# Patient Record
Sex: Female | Born: 1965 | Race: White | Hispanic: No | Marital: Married
Health system: Southern US, Community
[De-identification: ages and names within clinical notes are randomized; demographics above are authoritative.]

---

## 1998-09-22 ENCOUNTER — Other Ambulatory Visit: Admission: RE | Admit: 1998-09-22 | Discharge: 1998-09-22 | Payer: Self-pay | Admitting: *Deleted

## 1999-06-01 ENCOUNTER — Inpatient Hospital Stay (HOSPITAL_COMMUNITY): Admission: AD | Admit: 1999-06-01 | Discharge: 1999-06-03 | Payer: Self-pay | Admitting: Obstetrics and Gynecology

## 1999-06-04 ENCOUNTER — Encounter: Admission: RE | Admit: 1999-06-04 | Discharge: 1999-06-13 | Payer: Self-pay | Admitting: Obstetrics and Gynecology

## 1999-10-05 ENCOUNTER — Other Ambulatory Visit: Admission: RE | Admit: 1999-10-05 | Discharge: 1999-10-05 | Payer: Self-pay | Admitting: Obstetrics and Gynecology

## 2000-10-09 ENCOUNTER — Other Ambulatory Visit: Admission: RE | Admit: 2000-10-09 | Discharge: 2000-10-09 | Payer: Self-pay | Admitting: Obstetrics and Gynecology

## 2001-11-04 ENCOUNTER — Other Ambulatory Visit: Admission: RE | Admit: 2001-11-04 | Discharge: 2001-11-04 | Payer: Self-pay | Admitting: Obstetrics and Gynecology

## 2002-03-09 ENCOUNTER — Encounter: Payer: Self-pay | Admitting: Obstetrics and Gynecology

## 2002-03-09 ENCOUNTER — Ambulatory Visit (HOSPITAL_COMMUNITY): Admission: RE | Admit: 2002-03-09 | Discharge: 2002-03-09 | Payer: Self-pay | Admitting: Obstetrics and Gynecology

## 2002-04-03 ENCOUNTER — Encounter: Payer: Self-pay | Admitting: Obstetrics and Gynecology

## 2002-04-03 ENCOUNTER — Ambulatory Visit (HOSPITAL_COMMUNITY): Admission: RE | Admit: 2002-04-03 | Discharge: 2002-04-03 | Payer: Self-pay | Admitting: Obstetrics and Gynecology

## 2002-07-30 ENCOUNTER — Encounter: Payer: Self-pay | Admitting: Obstetrics and Gynecology

## 2002-07-30 ENCOUNTER — Ambulatory Visit (HOSPITAL_COMMUNITY): Admission: RE | Admit: 2002-07-30 | Discharge: 2002-07-30 | Payer: Self-pay | Admitting: Obstetrics and Gynecology

## 2002-08-14 ENCOUNTER — Inpatient Hospital Stay (HOSPITAL_COMMUNITY): Admission: AD | Admit: 2002-08-14 | Discharge: 2002-08-16 | Payer: Self-pay | Admitting: Obstetrics and Gynecology

## 2002-12-23 ENCOUNTER — Other Ambulatory Visit: Admission: RE | Admit: 2002-12-23 | Discharge: 2002-12-23 | Payer: Self-pay | Admitting: Obstetrics and Gynecology

## 2005-06-22 ENCOUNTER — Ambulatory Visit: Payer: Self-pay | Admitting: Internal Medicine

## 2009-01-18 ENCOUNTER — Ambulatory Visit (HOSPITAL_COMMUNITY): Admission: RE | Admit: 2009-01-18 | Discharge: 2009-01-18 | Payer: Self-pay | Admitting: Neurology

## 2010-06-07 ENCOUNTER — Other Ambulatory Visit: Payer: Self-pay | Admitting: Obstetrics and Gynecology

## 2010-06-07 DIAGNOSIS — R928 Other abnormal and inconclusive findings on diagnostic imaging of breast: Secondary | ICD-10-CM

## 2010-06-13 ENCOUNTER — Ambulatory Visit
Admission: RE | Admit: 2010-06-13 | Discharge: 2010-06-13 | Disposition: A | Discharge status: Home / Self Care | Payer: BC Managed Care – PPO | Source: Ambulatory Visit | Attending: Obstetrics and Gynecology | Admitting: Obstetrics and Gynecology

## 2010-06-13 DIAGNOSIS — R928 Other abnormal and inconclusive findings on diagnostic imaging of breast: Secondary | ICD-10-CM

## 2010-07-13 LAB — VDRL, CSF: VDRL Quant, CSF: NONREACTIVE

## 2010-07-13 LAB — GRAM STAIN

## 2010-07-13 LAB — MULTIPLE SCLEROSIS PANEL 2
Albumin: 3570 mg/dL — ABNORMAL LOW (ref 3700–5410)
IgA MSPROF: <0.001 mg/dL (ref <0.010)
IgG Total CSF: 2.7 mg/dL (ref 0.5–6.1)
IgG Total: 1423 mg/dL (ref 694–1618)
IgM MSPROF: <0.001 mg/dL (ref <0.010)
IgM-CSF: 0.05 mg/dL (ref <0.10)

## 2010-07-13 LAB — CSF CULTURE W GRAM STAIN: Culture: NO GROWTH

## 2010-07-13 LAB — CSF CELL COUNT WITH DIFFERENTIAL
Tube #: 1
Tube #: 4

## 2010-07-13 LAB — PROTEIN AND GLUCOSE, CSF
Glucose, CSF: 64 mg/dL (ref 43–76)
Total  Protein, CSF: 27 mg/dL (ref 15–45)

## 2010-07-13 LAB — ANGIOTENSIN CONVERTING ENZYME: Angiotensin-Converting Enzyme: 19 U/L (ref 9–67)

## 2010-12-05 ENCOUNTER — Other Ambulatory Visit: Payer: Self-pay | Admitting: Obstetrics and Gynecology

## 2010-12-05 DIAGNOSIS — N63 Unspecified lump in unspecified breast: Secondary | ICD-10-CM

## 2010-12-26 ENCOUNTER — Ambulatory Visit
Admission: RE | Admit: 2010-12-26 | Discharge: 2010-12-26 | Disposition: A | Discharge status: Home / Self Care | Payer: BC Managed Care – PPO | Source: Ambulatory Visit | Attending: Obstetrics and Gynecology | Admitting: Obstetrics and Gynecology

## 2010-12-26 ENCOUNTER — Other Ambulatory Visit: Payer: Self-pay | Admitting: Obstetrics and Gynecology

## 2010-12-26 DIAGNOSIS — N63 Unspecified lump in unspecified breast: Secondary | ICD-10-CM

## 2011-01-03 ENCOUNTER — Ambulatory Visit
Admission: RE | Admit: 2011-01-03 | Discharge: 2011-01-03 | Disposition: A | Discharge status: Home / Self Care | Payer: BC Managed Care – PPO | Source: Ambulatory Visit | Attending: Obstetrics and Gynecology | Admitting: Obstetrics and Gynecology

## 2011-01-03 ENCOUNTER — Other Ambulatory Visit: Payer: Self-pay | Admitting: Obstetrics and Gynecology

## 2011-01-03 DIAGNOSIS — N63 Unspecified lump in unspecified breast: Secondary | ICD-10-CM

## 2011-06-13 ENCOUNTER — Other Ambulatory Visit: Payer: Self-pay | Admitting: Obstetrics and Gynecology

## 2011-06-13 DIAGNOSIS — N6310 Unspecified lump in the right breast, unspecified quadrant: Secondary | ICD-10-CM

## 2011-06-26 ENCOUNTER — Ambulatory Visit
Admission: RE | Admit: 2011-06-26 | Discharge: 2011-06-26 | Disposition: A | Discharge status: Home / Self Care | Payer: BC Managed Care – PPO | Source: Ambulatory Visit | Attending: Obstetrics and Gynecology | Admitting: Obstetrics and Gynecology

## 2011-06-26 ENCOUNTER — Other Ambulatory Visit: Payer: Self-pay | Admitting: Obstetrics and Gynecology

## 2011-06-26 DIAGNOSIS — N6310 Unspecified lump in the right breast, unspecified quadrant: Secondary | ICD-10-CM

## 2012-06-09 ENCOUNTER — Emergency Department (HOSPITAL_BASED_OUTPATIENT_CLINIC_OR_DEPARTMENT_OTHER)
Admission: EM | Admit: 2012-06-09 | Discharge: 2012-06-09 | Disposition: A | Discharge status: Home / Self Care | Payer: PRIVATE HEALTH INSURANCE | Attending: Emergency Medicine | Admitting: Emergency Medicine

## 2012-06-09 ENCOUNTER — Emergency Department (HOSPITAL_COMMUNITY): Payer: PRIVATE HEALTH INSURANCE

## 2012-06-09 ENCOUNTER — Emergency Department (HOSPITAL_BASED_OUTPATIENT_CLINIC_OR_DEPARTMENT_OTHER): Payer: PRIVATE HEALTH INSURANCE

## 2012-06-09 ENCOUNTER — Encounter (HOSPITAL_BASED_OUTPATIENT_CLINIC_OR_DEPARTMENT_OTHER): Payer: Self-pay | Admitting: *Deleted

## 2012-06-09 DIAGNOSIS — G43909 Migraine, unspecified, not intractable, without status migrainosus: Secondary | ICD-10-CM | POA: Insufficient documentation

## 2012-06-09 DIAGNOSIS — R202 Paresthesia of skin: Secondary | ICD-10-CM

## 2012-06-09 LAB — CBC WITH DIFFERENTIAL/PLATELET
Eosinophils Absolute: 0 10*3/uL (ref 0.0–0.7)
Eosinophils Relative: 0 % (ref 0–5)
Hemoglobin: 13.6 g/dL (ref 12.0–15.0)
Lymphs Abs: 1.2 10*3/uL (ref 0.7–4.0)
MCH: 29.9 pg (ref 26.0–34.0)
MCV: 85.9 fL (ref 78.0–100.0)
Monocytes Absolute: 0.6 10*3/uL (ref 0.1–1.0)
Monocytes Relative: 9 % (ref 3–12)
RBC: 4.55 MIL/uL (ref 3.87–5.11)

## 2012-06-09 LAB — BASIC METABOLIC PANEL
BUN: 20 mg/dL (ref 6–23)
Calcium: 9.3 mg/dL (ref 8.4–10.5)
Creatinine, Ser: 0.7 mg/dL (ref 0.50–1.10)
GFR calc non Af Amer: >90 mL/min (ref >90)
Glucose, Bld: 105 mg/dL — ABNORMAL HIGH (ref 70–99)
Potassium: 3.9 mEq/L (ref 3.5–5.1)

## 2012-06-09 MED ORDER — KETOROLAC TROMETHAMINE 30 MG/ML IJ SOLN: 30.0000 mg | Freq: Once | INTRAMUSCULAR | Status: DC

## 2012-06-09 MED ORDER — GADOBENATE DIMEGLUMINE 529 MG/ML IV SOLN
15.0000 mL | Freq: Once | INTRAVENOUS | Status: AC | PRN
  Administered 2012-06-09: 15 mL via INTRAVENOUS

## 2012-06-09 MED ORDER — DIPHENHYDRAMINE HCL 50 MG/ML IJ SOLN: 25.0000 mg | Freq: Once | INTRAMUSCULAR | Status: DC

## 2012-06-09 MED ORDER — METOCLOPRAMIDE HCL 5 MG/ML IJ SOLN: 10.0000 mg | Freq: Once | INTRAMUSCULAR | Status: DC

## 2012-06-09 MED ORDER — IBUPROFEN 200 MG PO TABS
400.0000 mg | ORAL_TABLET | Freq: Once | ORAL | Status: AC
  Administered 2012-06-09: 400 mg via ORAL
  Filled 2012-06-09: qty 2

## 2012-06-09 NOTE — ED Provider Notes (Signed)
History     CSN: [id number]  Arrival date & time 06/09/12  1020   First MD Initiated Contact with Patient 06/09/12 1047      Chief Complaint  Patient presents with  . Numbness    (Consider location/radiation/quality/duration/timing/severity/associated sxs/prior treatment) HPI  Patient with numbness began at 0900 on left side face, lue, and lle while at work.  Patient has right sided facial numbness when she has migraines but has never had left sided numbness. Patient took ibuprofen today without relief of numbness.  She denies headache. She weakness, visual problems, walking or speaking.  She has not had similar symptoms in the past on this side of the facealthough she has had right sided facial numbness without headache.  She was assessed with multiple cts and mris per patient to ascertain this. Patient has had similar headaches with right sided symptoms.  These usually resolve withibuprofen.  Last imaging 2-3 years.      PMD Dr. Micheline Chapman,  [facility].     History reviewed. No pertinent past medical history.  History reviewed. No pertinent past surgical history.  History reviewed. No pertinent family history.  History  Substance Use Topics  . Smoking status: Not on file  . Smokeless tobacco: Not on file  . Alcohol Use: Not on file    OB History   Grav Para Term Preterm Abortions TAB SAB Ect Mult Living                  Review of Systems  Constitutional: Negative for fever, chills, activity change, appetite change and unexpected weight change.  HENT: Negative for sore throat, rhinorrhea, neck pain, neck stiffness and sinus pressure.   Eyes: Negative for visual disturbance.  Respiratory: Negative for cough and shortness of breath.   Cardiovascular: Negative for chest pain and leg swelling.  Gastrointestinal: Negative for vomiting, abdominal pain, diarrhea and blood in stool.  Genitourinary: Negative for dysuria, urgency, frequency, vaginal discharge  and difficulty urinating.  Musculoskeletal: Negative for myalgias, arthralgias and gait problem.  Skin: Negative for color change and rash.  Neurological: Negative for weakness, light-headedness and headaches.  Hematological: Does not bruise/bleed easily.  Psychiatric/Behavioral: Negative for dysphoric mood.    Allergies  Review of patient's allergies indicates no known allergies.  Home Medications   Current Outpatient Rx  Name  Route  Sig  Dispense  Refill  . gabapentin (NEURONTIN) 600 MG tablet   Oral   Take 600 mg by mouth daily. To prevent headaches         . ibuprofen (ADVIL,MOTRIN) 400 MG tablet   Oral   Take 400 mg by mouth every 6 (six) hours as needed for pain.           BP 125/54  Pulse 80  Temp(Src) 97.9 F (36.6 C) (Oral)  Resp 18  Ht 5\' 9"  (1.753 m)  Wt 172 lb (78.019 kg)  BMI 25.39 kg/m2  SpO2 100%  LMP 05/24/2012  Physical Exam  Nursing note and vitals reviewed. Constitutional: She is oriented to person, place, and time. She appears well-developed and well-nourished.  HENT:  Head: Normocephalic and atraumatic.  Right Ear: Tympanic membrane and external ear normal.  Left Ear: Tympanic membrane and external ear normal.  Nose: Nose normal. Right sinus exhibits no maxillary sinus tenderness and no frontal sinus tenderness. Left sinus exhibits no maxillary sinus tenderness and no frontal sinus tenderness.  Eyes: Conjunctivae and EOM are normal. Pupils are equal, round, and reactive to light.  Right eye exhibits no nystagmus. Left eye exhibits no nystagmus.  Cardiovascular: Normal rate, regular rhythm, normal heart sounds and intact distal pulses.   Pulmonary/Chest: Effort normal and breath sounds normal. No respiratory distress. She exhibits no tenderness.  Abdominal: Soft. Bowel sounds are normal. She exhibits no distension and no mass. There is no tenderness.  Neurological: She is alert and oriented to person, place, and time. She has normal strength and  normal reflexes. She displays normal reflexes. No sensory deficit. She exhibits normal muscle tone. She displays a negative Romberg sign. Coordination normal. GCS eye subscore is 4. GCS verbal subscore is 5. GCS motor subscore is 6.  Reflex Scores:      Tricep reflexes are 2+ on the right side and 2+ on the left side.      Bicep reflexes are 2+ on the right side and 2+ on the left side.      Brachioradialis reflexes are 2+ on the right side and 2+ on the left side.      Patellar reflexes are 2+ on the right side and 2+ on the left side.      Achilles reflexes are 2+ on the right side and 2+ on the left side. Patient with normal gait without ataxia, shuffling, spasm, or antalgia. Speech is normal without dysarthria, dysphasia, or aphasia. Muscle strength is 5/5 in bilateral shoulders, elbow flexor and extensors, wrist flexor and extensors, and intrinsic hand muscles. 5/5 bilateral lower extremity hip flexors, extensors, knee flexors and extensors, and ankle dorsi and plantar flexors.  Sharp sensation decrreased left to right.   Skin: Skin is warm and dry. No rash noted.  Psychiatric: She has a normal mood and affect. Her behavior is normal. Judgment and thought content normal.    ED Course  Procedures (including critical care time)  Labs Reviewed - No data to display No results found.   No diagnosis found.   Date: 06/09/2012  Rate: 76  Rhythm: normal sinus rhythm  QRS Axis: normal  Intervals: normal  ST/T Wave abnormalities: normal  Conduction Disutrbances: none  Narrative Interpretation: unremarkable   Results for orders placed during the hospital encounter of 06/09/12  CBC WITH DIFFERENTIAL      Result Value Range   WBC 6.6  4.0 - 10.5 K/uL   RBC 4.55  3.87 - 5.11 MIL/uL   Hemoglobin 13.6  12.0 - 15.0 g/dL   HCT 56.2  13.0 - 86.5 %   MCV 85.9  78.0 - 100.0 fL   MCH 29.9  26.0 - 34.0 pg   MCHC 34.8  30.0 - 36.0 g/dL   RDW 78.4  69.6 - 29.5 %   Platelets 279  150 - 400  K/uL   Neutrophils Relative 72  43 - 77 %   Neutro Abs 4.8  1.7 - 7.7 K/uL   Lymphocytes Relative 18  12 - 46 %   Lymphs Abs 1.2  0.7 - 4.0 K/uL   Monocytes Relative 9  3 - 12 %   Monocytes Absolute 0.6  0.1 - 1.0 K/uL   Eosinophils Relative 0  0 - 5 %   Eosinophils Absolute 0.0  0.0 - 0.7 K/uL   Basophils Relative 1  0 - 1 %   Basophils Absolute 0.0  0.0 - 0.1 K/uL  PREGNANCY, URINE      Result Value Range   Preg Test, Ur NEGATIVE  NEGATIVE        MDM  Patient with NIH stroke scale of 1.  Patient care discussed with Dr. Leroy Kennedy.  He agrees that patient does not qualify for code stroke given low NIH stroke scale. Plan transfer patient to [facility] for MRI. I discussed this with the patient and with Dr. Manus Gunning.    Patient likely has paresthesias secondary to migraines she has had similar symptoms on the right side in the past. However, I do to the change in laterality of her symptoms she will require repeat imaging.   CRITICAL CARE Performed by: Hilario Quarry   Total critical care time: 45  Critical care time was exclusive of separately billable procedures and treating other patients.  Critical care was necessary to treat or prevent imminent or life-threatening deterioration.  Critical care was time spent personally by me on the following activities: development of treatment plan with patient and/or surrogate as well as nursing, discussions with consultants, evaluation of patient's response to treatment, examination of patient, obtaining history from patient or surrogate, ordering and performing treatments and interventions, ordering and review of laboratory studies, ordering and review of radiographic studies, pulse oximetry and re-evaluation of patient's condition.   Hilario Quarry, MD 06/09/12 1155

## 2012-06-09 NOTE — ED Notes (Signed)
EKG transmitted with errors (time stamp 11:28, 06/09/2012).  EKG department notified of error and EKG deleted from patient chart.

## 2012-06-09 NOTE — ED Provider Notes (Signed)
Patient transferred from med center [facility] for MRI. She is episode of left-sided numbness that has since resolved. She is in no distress and her neurological exam is nonfocal. History of migraines with right-sided paresthesias in the past.  CN 2-12 intact, 5/5 strength throughout, no ataxia on finger to nose.  BP 118/58  Pulse 72  Temp(Src) 98.1 F (36.7 C) (Oral)  Resp 20  Ht 5\' 9"  (1.753 m)  Wt 172 lb (78.019 kg)  BMI 25.39 kg/m2  SpO2 99%  LMP 05/24/2012  Will obtain MRI per Dr. Denny Levy plan. MRI and MRA are negative. Patient's symptoms has resolved. She is referred to neurology for followup. She is stable for discharge.  Glynn Octave, MD 06/09/12 [phone]

## 2012-06-09 NOTE — ED Notes (Signed)
Pt assisted to call her family and update her husband on plan of care.

## 2012-06-09 NOTE — ED Notes (Signed)
Pt assisted into gown for md exam.

## 2012-06-09 NOTE — ED Notes (Signed)
Pt. Arrived via [facility] to room Pod C27. Pt. Is alert and oriented X3, no neuro deficits noted and pt. Denies any pain. Dr. Virl Axe is at the bedside.

## 2012-06-09 NOTE — ED Notes (Signed)
Pt care transferred to [facility], pt to be transported to ed at [facility]. Pt and husband are aware of plan of care.

## 2012-06-09 NOTE — ED Notes (Signed)
EMS--is transporting patient to [facility] ED

## 2012-06-09 NOTE — ED Notes (Signed)
Report to morgan, charge nurse, [facility] ed.

## 2012-06-09 NOTE — ED Notes (Signed)
Pt amb to room 3 with quick steady gait in nad. Pt reports that she has had right sided facial numbness for years off and on, pt states she saw a neurologist years ago and told "something was misfiring" states she was told it was an atypical headache. Pt states she had the same type of numbness on the left side. She has never had the numbness on her left face in the past, pt states it also went to her left arm and leg. Speech is clear, moe + x 4 ext, grips are = and strong, pearl, smile is symmetrical.

## 2012-06-26 ENCOUNTER — Encounter: Payer: Self-pay | Admitting: Neurology

## 2012-06-30 NOTE — Progress Notes (Signed)
This encounter was created in error - please disregard.

## 2012-07-30 ENCOUNTER — Other Ambulatory Visit: Payer: Self-pay | Admitting: Specialist

## 2012-07-30 DIAGNOSIS — G971 Other reaction to spinal and lumbar puncture: Secondary | ICD-10-CM

## 2012-07-31 ENCOUNTER — Ambulatory Visit
Admission: RE | Admit: 2012-07-31 | Discharge: 2012-07-31 | Disposition: A | Discharge status: Home / Self Care | Payer: No Typology Code available for payment source | Source: Ambulatory Visit | Attending: Specialist | Admitting: Specialist

## 2012-07-31 DIAGNOSIS — G970 Cerebrospinal fluid leak from spinal puncture: Secondary | ICD-10-CM

## 2012-07-31 DIAGNOSIS — G971 Other reaction to spinal and lumbar puncture: Secondary | ICD-10-CM

## 2012-07-31 MED ORDER — IOHEXOL 180 MG/ML  SOLN
1.0000 mL | Freq: Once | INTRAMUSCULAR | Status: AC | PRN
  Administered 2012-07-31: 1 mL via EPIDURAL

## 2012-07-31 NOTE — Progress Notes (Signed)
Dr. Carlota Raspberry in to check on patient.  jkl

## 2012-07-31 NOTE — Progress Notes (Signed)
Dr. Carlota Raspberry in to speak with patient and her husband.  jkl

## 2012-07-31 NOTE — Progress Notes (Signed)
20cc blood drawn for procedure from right [facility] space without difficulty; site unremarkable.  jkl

## 2012-07-31 NOTE — Progress Notes (Signed)
Husband at bedside.  Patient resting quietly without complaint.  jkl 

## 2012-09-04 ENCOUNTER — Other Ambulatory Visit: Payer: Self-pay | Admitting: Neurosurgery

## 2012-09-04 DIAGNOSIS — G9600 Cerebrospinal fluid leak, unspecified: Secondary | ICD-10-CM

## 2012-09-08 ENCOUNTER — Ambulatory Visit
Admission: RE | Admit: 2012-09-08 | Discharge: 2012-09-08 | Disposition: A | Discharge status: Home / Self Care | Payer: No Typology Code available for payment source | Source: Ambulatory Visit | Attending: Neurosurgery | Admitting: Neurosurgery

## 2012-09-08 DIAGNOSIS — G9600 Cerebrospinal fluid leak, unspecified: Secondary | ICD-10-CM

## 2012-10-14 ENCOUNTER — Ambulatory Visit: Payer: No Typology Code available for payment source | Admitting: Internal Medicine

## 2012-11-05 ENCOUNTER — Ambulatory Visit: Payer: Self-pay | Admitting: Neurology

## 2013-02-12 ENCOUNTER — Other Ambulatory Visit: Payer: Self-pay

## 2013-05-01 ENCOUNTER — Other Ambulatory Visit: Payer: Self-pay

## 2013-05-11 ENCOUNTER — Other Ambulatory Visit: Payer: Self-pay | Admitting: Obstetrics and Gynecology

## 2013-05-11 DIAGNOSIS — N63 Unspecified lump in unspecified breast: Secondary | ICD-10-CM

## 2013-05-12 ENCOUNTER — Other Ambulatory Visit: Payer: Self-pay

## 2013-05-12 DIAGNOSIS — R51 Headache: Secondary | ICD-10-CM

## 2013-05-20 ENCOUNTER — Other Ambulatory Visit: Payer: No Typology Code available for payment source

## 2013-05-21 ENCOUNTER — Ambulatory Visit
Admission: RE | Admit: 2013-05-21 | Discharge: 2013-05-21 | Disposition: A | Discharge status: Home / Self Care | Payer: PRIVATE HEALTH INSURANCE | Source: Ambulatory Visit

## 2013-05-21 DIAGNOSIS — R51 Headache: Secondary | ICD-10-CM

## 2013-05-21 MED ORDER — GADOBENATE DIMEGLUMINE 529 MG/ML IV SOLN
16.0000 mL | Freq: Once | INTRAVENOUS | Status: AC | PRN
  Administered 2013-05-21: 16 mL via INTRAVENOUS

## 2013-07-27 ENCOUNTER — Ambulatory Visit
Admission: RE | Admit: 2013-07-27 | Discharge: 2013-07-27 | Disposition: A | Discharge status: Home / Self Care | Payer: PRIVATE HEALTH INSURANCE | Source: Ambulatory Visit | Attending: Obstetrics and Gynecology | Admitting: Obstetrics and Gynecology

## 2013-07-27 DIAGNOSIS — N63 Unspecified lump in unspecified breast: Secondary | ICD-10-CM

## 2014-07-30 ENCOUNTER — Other Ambulatory Visit: Payer: Self-pay

## 2014-07-30 DIAGNOSIS — Z1231 Encounter for screening mammogram for malignant neoplasm of breast: Secondary | ICD-10-CM

## 2014-08-18 ENCOUNTER — Ambulatory Visit
Admission: RE | Admit: 2014-08-18 | Discharge: 2014-08-18 | Disposition: A | Discharge status: Home / Self Care | Payer: PRIVATE HEALTH INSURANCE | Source: Ambulatory Visit

## 2014-08-18 DIAGNOSIS — Z1231 Encounter for screening mammogram for malignant neoplasm of breast: Secondary | ICD-10-CM

## 2015-12-23 ENCOUNTER — Other Ambulatory Visit: Payer: Self-pay | Admitting: Obstetrics and Gynecology

## 2015-12-23 DIAGNOSIS — Z1231 Encounter for screening mammogram for malignant neoplasm of breast: Secondary | ICD-10-CM

## 2016-01-02 ENCOUNTER — Ambulatory Visit
Admission: RE | Admit: 2016-01-02 | Discharge: 2016-01-02 | Disposition: A | Discharge status: Home / Self Care | Payer: PRIVATE HEALTH INSURANCE | Source: Ambulatory Visit | Attending: Obstetrics and Gynecology | Admitting: Obstetrics and Gynecology

## 2016-01-02 DIAGNOSIS — Z1231 Encounter for screening mammogram for malignant neoplasm of breast: Secondary | ICD-10-CM

## 2016-01-09 ENCOUNTER — Other Ambulatory Visit: Payer: Self-pay | Admitting: Obstetrics and Gynecology

## 2016-01-10 LAB — CYTOLOGY - PAP

## 2017-04-09 DIAGNOSIS — Z0101 Encounter for examination of eyes and vision with abnormal findings: Secondary | ICD-10-CM | POA: Diagnosis not present

## 2017-04-10 DIAGNOSIS — R69 Illness, unspecified: Secondary | ICD-10-CM | POA: Diagnosis not present

## 2017-04-26 DIAGNOSIS — R69 Illness, unspecified: Secondary | ICD-10-CM | POA: Diagnosis not present

## 2017-05-27 DIAGNOSIS — R51 Headache: Secondary | ICD-10-CM | POA: Diagnosis not present

## 2017-12-10 DIAGNOSIS — R51 Headache: Secondary | ICD-10-CM | POA: Diagnosis not present

## 2018-01-02 DIAGNOSIS — R69 Illness, unspecified: Secondary | ICD-10-CM | POA: Diagnosis not present

## 2018-02-13 DIAGNOSIS — Z124 Encounter for screening for malignant neoplasm of cervix: Secondary | ICD-10-CM | POA: Diagnosis not present

## 2018-02-13 DIAGNOSIS — Z1231 Encounter for screening mammogram for malignant neoplasm of breast: Secondary | ICD-10-CM | POA: Diagnosis not present

## 2018-02-13 DIAGNOSIS — Z01419 Encounter for gynecological examination (general) (routine) without abnormal findings: Secondary | ICD-10-CM | POA: Diagnosis not present

## 2018-03-04 DIAGNOSIS — R69 Illness, unspecified: Secondary | ICD-10-CM | POA: Diagnosis not present

## 2018-03-28 IMAGING — MG DIGITAL SCREENING BILATERAL MAMMOGRAM WITH CAD
4 series · 4 of 4 positions shown · non-contrast
Comparison: Previous exam(s).

CLINICAL DATA: Screening.

EXAM:
DIGITAL SCREENING BILATERAL MAMMOGRAM WITH CAD

[L MLO]
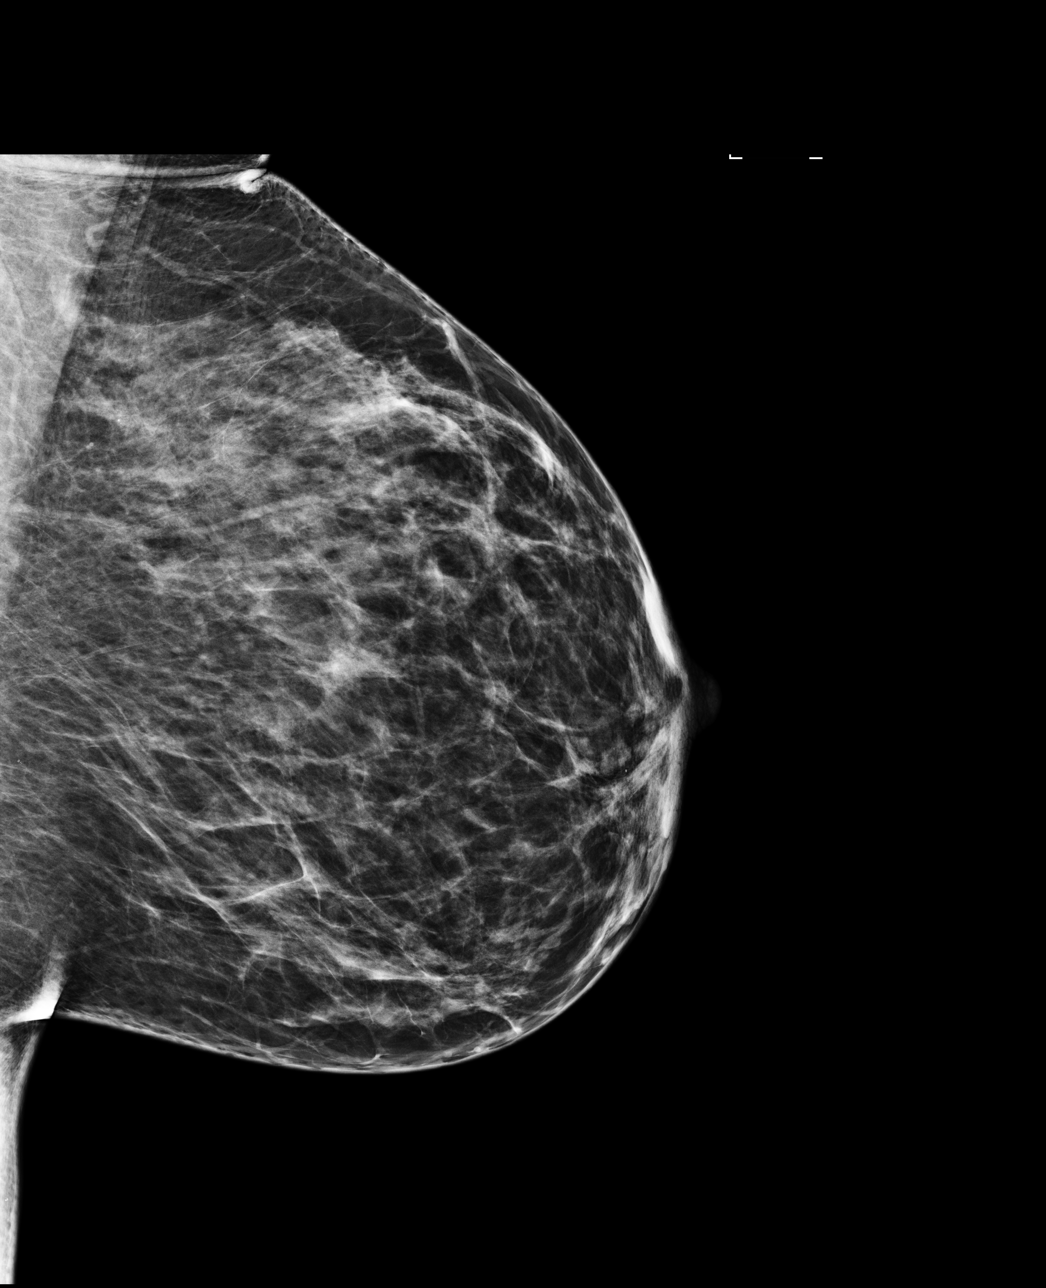

[R CC]
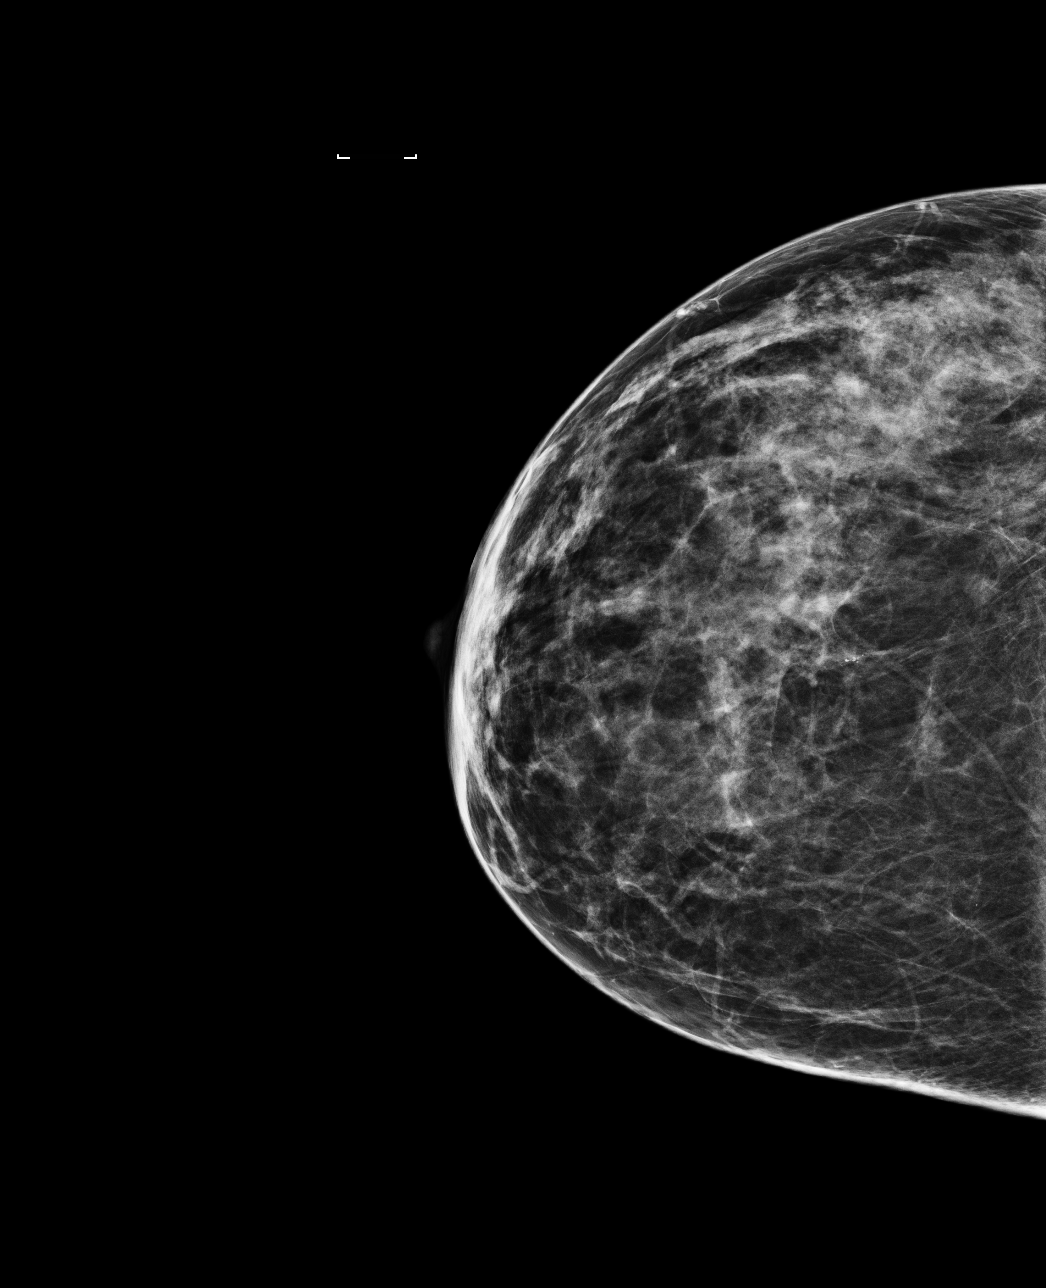

[R MLO]
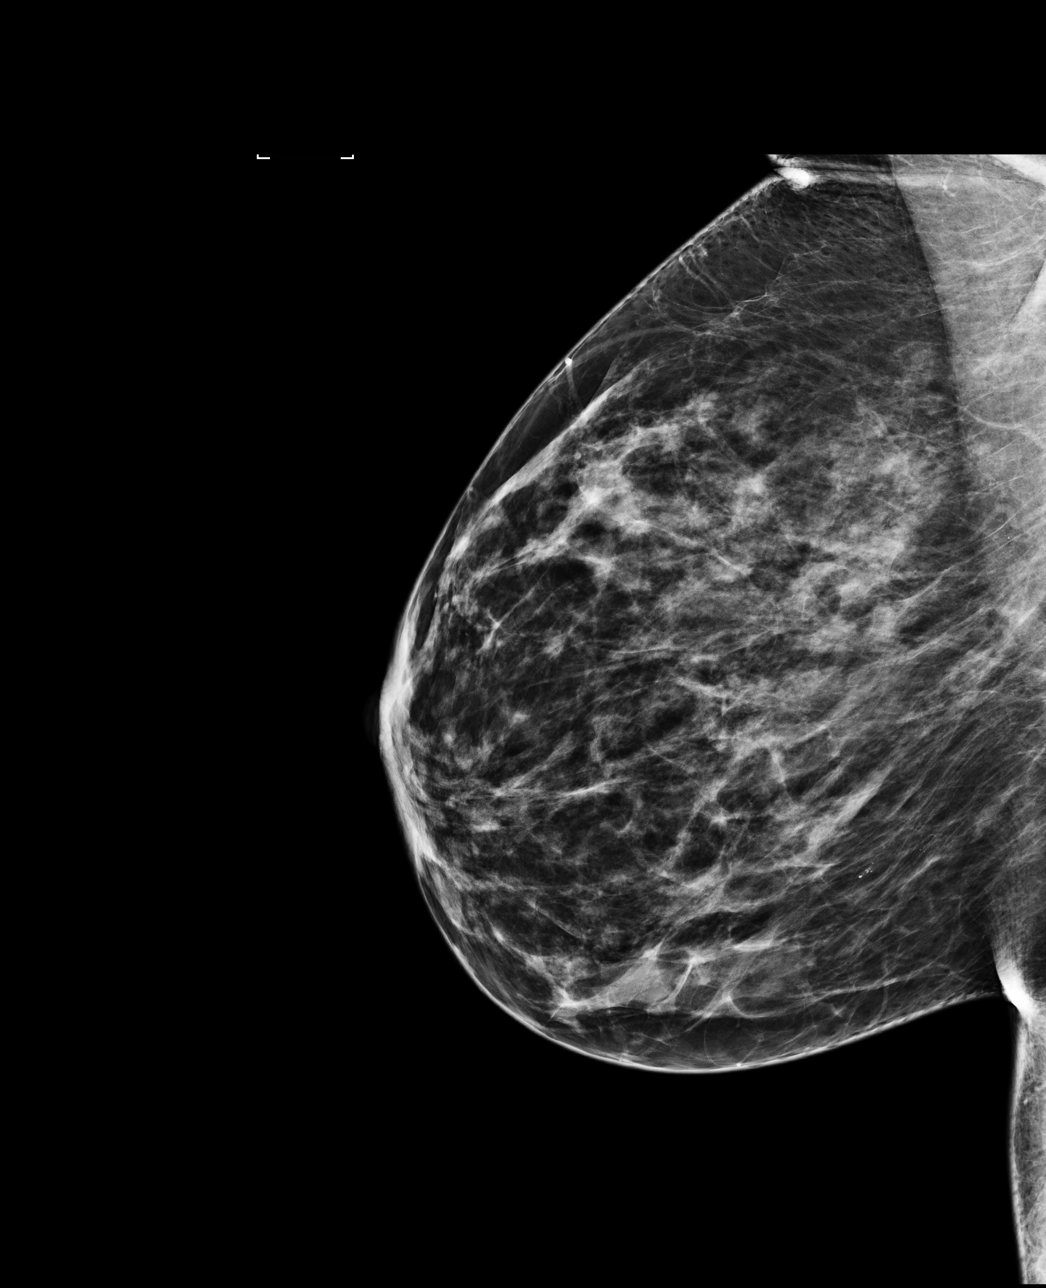

[L CC]
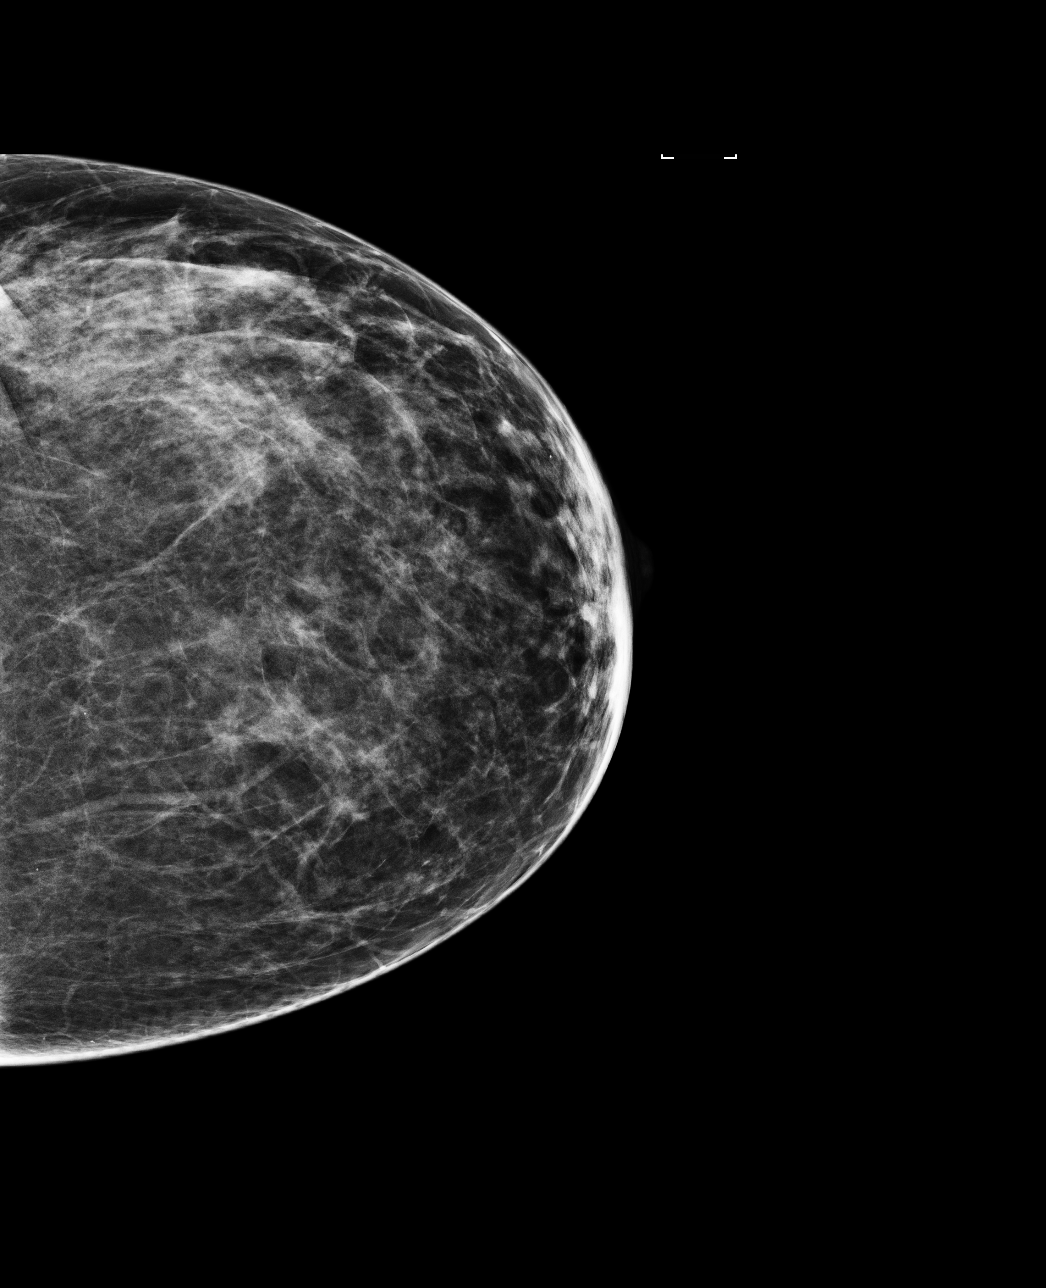

[4 of 4 positions shown; findings below may reference images not displayed]

ACR Breast Density Category c: The breast tissue is heterogeneously
dense, which may obscure small masses.
FINDINGS: There are no findings suspicious for malignancy. Images were
processed with CAD.
IMPRESSION: No mammographic evidence of malignancy. A result letter of this
screening mammogram will be mailed directly to the patient.

RECOMMENDATION:
Screening mammogram in one year. (Code:YJ-[id number])

BI-RADS CATEGORY  1: Negative.

## 2018-06-16 DIAGNOSIS — R69 Illness, unspecified: Secondary | ICD-10-CM | POA: Diagnosis not present

## 2018-07-11 DIAGNOSIS — G960 Cerebrospinal fluid leak: Secondary | ICD-10-CM | POA: Diagnosis not present

## 2018-07-11 DIAGNOSIS — M5124 Other intervertebral disc displacement, thoracic region: Secondary | ICD-10-CM | POA: Diagnosis not present

## 2018-07-11 DIAGNOSIS — R51 Headache: Secondary | ICD-10-CM | POA: Diagnosis not present

## 2018-07-11 DIAGNOSIS — M5030 Other cervical disc degeneration, unspecified cervical region: Secondary | ICD-10-CM | POA: Diagnosis not present

## 2018-07-22 DIAGNOSIS — R51 Headache: Secondary | ICD-10-CM | POA: Diagnosis not present

## 2018-12-22 DIAGNOSIS — H5213 Myopia, bilateral: Secondary | ICD-10-CM | POA: Diagnosis not present

## 2019-01-07 DIAGNOSIS — R69 Illness, unspecified: Secondary | ICD-10-CM | POA: Diagnosis not present

## 2019-01-21 DIAGNOSIS — R519 Headache, unspecified: Secondary | ICD-10-CM | POA: Diagnosis not present

## 2019-01-21 DIAGNOSIS — R51 Headache with orthostatic component, not elsewhere classified: Secondary | ICD-10-CM | POA: Diagnosis not present

## 2019-02-12 DIAGNOSIS — Z0100 Encounter for examination of eyes and vision without abnormal findings: Secondary | ICD-10-CM | POA: Diagnosis not present

## 2019-04-30 DIAGNOSIS — Z1231 Encounter for screening mammogram for malignant neoplasm of breast: Secondary | ICD-10-CM | POA: Diagnosis not present

## 2019-04-30 DIAGNOSIS — Z01419 Encounter for gynecological examination (general) (routine) without abnormal findings: Secondary | ICD-10-CM | POA: Diagnosis not present

## 2019-04-30 DIAGNOSIS — N912 Amenorrhea, unspecified: Secondary | ICD-10-CM | POA: Diagnosis not present

## 2019-04-30 DIAGNOSIS — Z124 Encounter for screening for malignant neoplasm of cervix: Secondary | ICD-10-CM | POA: Diagnosis not present

## 2019-05-04 DIAGNOSIS — M791 Myalgia, unspecified site: Secondary | ICD-10-CM | POA: Diagnosis not present

## 2019-05-04 DIAGNOSIS — Z13220 Encounter for screening for lipoid disorders: Secondary | ICD-10-CM | POA: Diagnosis not present

## 2019-05-04 DIAGNOSIS — M2550 Pain in unspecified joint: Secondary | ICD-10-CM | POA: Diagnosis not present

## 2019-05-04 DIAGNOSIS — M6289 Other specified disorders of muscle: Secondary | ICD-10-CM | POA: Diagnosis not present

## 2019-05-29 DIAGNOSIS — M6289 Other specified disorders of muscle: Secondary | ICD-10-CM | POA: Diagnosis not present

## 2019-05-29 DIAGNOSIS — M2550 Pain in unspecified joint: Secondary | ICD-10-CM | POA: Diagnosis not present

## 2019-05-29 DIAGNOSIS — M791 Myalgia, unspecified site: Secondary | ICD-10-CM | POA: Diagnosis not present

## 2019-05-29 DIAGNOSIS — Z136 Encounter for screening for cardiovascular disorders: Secondary | ICD-10-CM | POA: Diagnosis not present

## 2019-06-16 DIAGNOSIS — Z1211 Encounter for screening for malignant neoplasm of colon: Secondary | ICD-10-CM | POA: Diagnosis not present

## 2019-06-16 DIAGNOSIS — Z90710 Acquired absence of both cervix and uterus: Secondary | ICD-10-CM | POA: Diagnosis not present

## 2019-06-16 DIAGNOSIS — Z20828 Contact with and (suspected) exposure to other viral communicable diseases: Secondary | ICD-10-CM | POA: Diagnosis not present

## 2019-07-03 ENCOUNTER — Ambulatory Visit: Payer: Medicare HMO | Attending: Internal Medicine

## 2019-07-03 DIAGNOSIS — Z23 Encounter for immunization: Secondary | ICD-10-CM

## 2019-07-03 NOTE — Progress Notes (Signed)
   Covid-19 Vaccination Clinic  Name:  Amy Humphrey    MRN: [id number] DOB: March 19, 1966  07/03/2019  [name] was observed post Covid-19 immunization for 15 minutes without incident. She was provided with Vaccine Information Sheet and instruction to access the V-Safe system.   [name] was instructed to call 911 with any severe reactions post vaccine: Marland Kitchen Difficulty breathing  . Swelling of face and throat  . A fast heartbeat  . A bad rash all over body  . Dizziness and weakness   Immunizations Administered    Name Date Dose VIS Date Route   Pfizer COVID-19 Vaccine 07/03/2019  3:08 PM 0.3 mL 03/20/2019 Intramuscular   Manufacturer: [place]   Lot: [id number]   [place]: [id number]

## 2019-07-07 DIAGNOSIS — E538 Deficiency of other specified B group vitamins: Secondary | ICD-10-CM | POA: Diagnosis not present

## 2019-07-08 DIAGNOSIS — R69 Illness, unspecified: Secondary | ICD-10-CM | POA: Diagnosis not present

## 2019-07-25 ENCOUNTER — Ambulatory Visit: Payer: Medicare HMO

## 2019-07-27 ENCOUNTER — Ambulatory Visit: Payer: Medicare HMO | Attending: Internal Medicine

## 2019-07-27 DIAGNOSIS — Z23 Encounter for immunization: Secondary | ICD-10-CM

## 2019-07-27 NOTE — Progress Notes (Signed)
   Covid-19 Vaccination Clinic  Name:  TYCEE BLAMER    MRN: [id number] DOB: 09/30/1965  07/27/2019  Ms. Joynt was observed post Covid-19 immunization for 15 minutes without incident. She was provided with Vaccine Information Sheet and instruction to access the V-Safe system.   Ms. Thon was instructed to call 911 with any severe reactions post vaccine: Marland Kitchen Difficulty breathing  . Swelling of face and throat  . A fast heartbeat  . A bad rash all over body  . Dizziness and weakness   Immunizations Administered    Name Date Dose VIS Date Route   Pfizer COVID-19 Vaccine 07/27/2019  3:57 PM 0.3 mL 06/03/2018 Intramuscular   Manufacturer: [place]   Lot: [id number]   [place]: [id number]

## 2019-07-28 ENCOUNTER — Ambulatory Visit: Payer: Medicare HMO

## 2019-09-03 DIAGNOSIS — K648 Other hemorrhoids: Secondary | ICD-10-CM | POA: Diagnosis not present

## 2019-09-10 DIAGNOSIS — K648 Other hemorrhoids: Secondary | ICD-10-CM | POA: Diagnosis not present

## 2019-09-21 DIAGNOSIS — R69 Illness, unspecified: Secondary | ICD-10-CM | POA: Diagnosis not present

## 2019-10-06 DIAGNOSIS — K648 Other hemorrhoids: Secondary | ICD-10-CM | POA: Diagnosis not present

## 2019-10-22 DIAGNOSIS — I10 Essential (primary) hypertension: Secondary | ICD-10-CM | POA: Diagnosis not present

## 2019-10-22 DIAGNOSIS — R519 Headache, unspecified: Secondary | ICD-10-CM | POA: Diagnosis not present

## 2019-10-22 DIAGNOSIS — R51 Headache with orthostatic component, not elsewhere classified: Secondary | ICD-10-CM | POA: Diagnosis not present

## 2019-11-10 DIAGNOSIS — R69 Illness, unspecified: Secondary | ICD-10-CM | POA: Diagnosis not present

## 2019-11-29 DIAGNOSIS — Z20828 Contact with and (suspected) exposure to other viral communicable diseases: Secondary | ICD-10-CM | POA: Diagnosis not present

## 2019-12-01 DIAGNOSIS — Z20828 Contact with and (suspected) exposure to other viral communicable diseases: Secondary | ICD-10-CM | POA: Diagnosis not present

## 2020-01-02 DIAGNOSIS — Z0100 Encounter for examination of eyes and vision without abnormal findings: Secondary | ICD-10-CM | POA: Diagnosis not present

## 2020-02-10 DIAGNOSIS — R69 Illness, unspecified: Secondary | ICD-10-CM | POA: Diagnosis not present

## 2020-03-14 DIAGNOSIS — R51 Headache with orthostatic component, not elsewhere classified: Secondary | ICD-10-CM | POA: Diagnosis not present

## 2020-04-12 DIAGNOSIS — H5213 Myopia, bilateral: Secondary | ICD-10-CM | POA: Diagnosis not present

## 2020-05-12 DIAGNOSIS — H16223 Keratoconjunctivitis sicca, not specified as Sjogren's, bilateral: Secondary | ICD-10-CM | POA: Diagnosis not present

## 2020-05-16 DIAGNOSIS — F332 Major depressive disorder, recurrent severe without psychotic features: Secondary | ICD-10-CM | POA: Diagnosis not present

## 2020-05-16 DIAGNOSIS — R69 Illness, unspecified: Secondary | ICD-10-CM | POA: Diagnosis not present

## 2020-05-17 DIAGNOSIS — F410 Panic disorder [episodic paroxysmal anxiety] without agoraphobia: Secondary | ICD-10-CM | POA: Diagnosis not present

## 2020-05-17 DIAGNOSIS — R69 Illness, unspecified: Secondary | ICD-10-CM | POA: Diagnosis not present

## 2020-05-17 DIAGNOSIS — F4321 Adjustment disorder with depressed mood: Secondary | ICD-10-CM | POA: Diagnosis not present

## 2020-06-03 DIAGNOSIS — R69 Illness, unspecified: Secondary | ICD-10-CM | POA: Diagnosis not present

## 2020-06-03 DIAGNOSIS — F332 Major depressive disorder, recurrent severe without psychotic features: Secondary | ICD-10-CM | POA: Diagnosis not present

## 2020-06-10 DIAGNOSIS — R51 Headache with orthostatic component, not elsewhere classified: Secondary | ICD-10-CM | POA: Diagnosis not present

## 2020-06-17 DIAGNOSIS — F332 Major depressive disorder, recurrent severe without psychotic features: Secondary | ICD-10-CM | POA: Diagnosis not present

## 2020-06-17 DIAGNOSIS — R69 Illness, unspecified: Secondary | ICD-10-CM | POA: Diagnosis not present

## 2020-06-23 DIAGNOSIS — G9600 Cerebrospinal fluid leak, unspecified: Secondary | ICD-10-CM | POA: Diagnosis not present

## 2020-06-23 DIAGNOSIS — Z79899 Other long term (current) drug therapy: Secondary | ICD-10-CM | POA: Diagnosis not present

## 2020-06-23 DIAGNOSIS — Z87891 Personal history of nicotine dependence: Secondary | ICD-10-CM | POA: Diagnosis not present

## 2020-06-23 DIAGNOSIS — J3489 Other specified disorders of nose and nasal sinuses: Secondary | ICD-10-CM | POA: Diagnosis not present

## 2020-07-06 DIAGNOSIS — Z01419 Encounter for gynecological examination (general) (routine) without abnormal findings: Secondary | ICD-10-CM | POA: Diagnosis not present

## 2020-07-06 DIAGNOSIS — Z124 Encounter for screening for malignant neoplasm of cervix: Secondary | ICD-10-CM | POA: Diagnosis not present

## 2020-07-07 DIAGNOSIS — R51 Headache with orthostatic component, not elsewhere classified: Secondary | ICD-10-CM | POA: Diagnosis not present

## 2020-07-07 DIAGNOSIS — R519 Headache, unspecified: Secondary | ICD-10-CM | POA: Diagnosis not present

## 2020-07-07 DIAGNOSIS — J3489 Other specified disorders of nose and nasal sinuses: Secondary | ICD-10-CM | POA: Diagnosis not present

## 2020-07-08 DIAGNOSIS — F332 Major depressive disorder, recurrent severe without psychotic features: Secondary | ICD-10-CM | POA: Diagnosis not present

## 2020-07-08 DIAGNOSIS — R69 Illness, unspecified: Secondary | ICD-10-CM | POA: Diagnosis not present

## 2020-07-19 DIAGNOSIS — Z1231 Encounter for screening mammogram for malignant neoplasm of breast: Secondary | ICD-10-CM | POA: Diagnosis not present

## 2020-07-26 DIAGNOSIS — F332 Major depressive disorder, recurrent severe without psychotic features: Secondary | ICD-10-CM | POA: Diagnosis not present

## 2020-07-26 DIAGNOSIS — R69 Illness, unspecified: Secondary | ICD-10-CM | POA: Diagnosis not present

## 2020-08-16 DIAGNOSIS — L920 Granuloma annulare: Secondary | ICD-10-CM | POA: Diagnosis not present

## 2020-08-18 DIAGNOSIS — F332 Major depressive disorder, recurrent severe without psychotic features: Secondary | ICD-10-CM | POA: Diagnosis not present

## 2020-08-18 DIAGNOSIS — R69 Illness, unspecified: Secondary | ICD-10-CM | POA: Diagnosis not present

## 2020-08-23 DIAGNOSIS — R51 Headache with orthostatic component, not elsewhere classified: Secondary | ICD-10-CM | POA: Diagnosis not present

## 2020-09-13 DIAGNOSIS — R69 Illness, unspecified: Secondary | ICD-10-CM | POA: Diagnosis not present

## 2020-10-10 DIAGNOSIS — R69 Illness, unspecified: Secondary | ICD-10-CM | POA: Diagnosis not present

## 2020-12-16 DIAGNOSIS — H43811 Vitreous degeneration, right eye: Secondary | ICD-10-CM | POA: Diagnosis not present

## 2020-12-20 DIAGNOSIS — F422 Mixed obsessional thoughts and acts: Secondary | ICD-10-CM | POA: Diagnosis not present

## 2020-12-20 DIAGNOSIS — R69 Illness, unspecified: Secondary | ICD-10-CM | POA: Diagnosis not present

## 2021-01-01 DIAGNOSIS — U071 COVID-19: Secondary | ICD-10-CM | POA: Diagnosis not present

## 2021-01-02 DIAGNOSIS — R69 Illness, unspecified: Secondary | ICD-10-CM | POA: Diagnosis not present

## 2021-05-10 DIAGNOSIS — T07XXXA Unspecified multiple injuries, initial encounter: Secondary | ICD-10-CM | POA: Diagnosis not present

## 2021-05-25 DIAGNOSIS — M542 Cervicalgia: Secondary | ICD-10-CM | POA: Diagnosis not present

## 2021-09-26 DIAGNOSIS — Z1231 Encounter for screening mammogram for malignant neoplasm of breast: Secondary | ICD-10-CM | POA: Diagnosis not present

## 2021-09-26 DIAGNOSIS — Z01419 Encounter for gynecological examination (general) (routine) without abnormal findings: Secondary | ICD-10-CM | POA: Diagnosis not present

## 2021-11-27 DIAGNOSIS — Z20822 Contact with and (suspected) exposure to covid-19: Secondary | ICD-10-CM | POA: Diagnosis not present

## 2021-12-04 DIAGNOSIS — G4700 Insomnia, unspecified: Secondary | ICD-10-CM | POA: Diagnosis not present

## 2021-12-04 DIAGNOSIS — R69 Illness, unspecified: Secondary | ICD-10-CM | POA: Diagnosis not present

## 2021-12-04 DIAGNOSIS — F422 Mixed obsessional thoughts and acts: Secondary | ICD-10-CM | POA: Diagnosis not present

## 2022-01-30 DIAGNOSIS — R51 Headache with orthostatic component, not elsewhere classified: Secondary | ICD-10-CM | POA: Diagnosis not present

## 2022-03-05 DIAGNOSIS — H43811 Vitreous degeneration, right eye: Secondary | ICD-10-CM | POA: Diagnosis not present

## 2022-03-05 DIAGNOSIS — D3131 Benign neoplasm of right choroid: Secondary | ICD-10-CM | POA: Diagnosis not present

## 2022-04-03 DIAGNOSIS — Z0100 Encounter for examination of eyes and vision without abnormal findings: Secondary | ICD-10-CM | POA: Diagnosis not present

## 2022-04-10 DIAGNOSIS — R69 Illness, unspecified: Secondary | ICD-10-CM | POA: Diagnosis not present

## 2022-05-17 DIAGNOSIS — Z1211 Encounter for screening for malignant neoplasm of colon: Secondary | ICD-10-CM | POA: Diagnosis not present

## 2022-05-17 DIAGNOSIS — G970 Cerebrospinal fluid leak from spinal puncture: Secondary | ICD-10-CM | POA: Diagnosis not present

## 2022-05-17 DIAGNOSIS — Z0000 Encounter for general adult medical examination without abnormal findings: Secondary | ICD-10-CM | POA: Diagnosis not present

## 2022-05-17 DIAGNOSIS — E7800 Pure hypercholesterolemia, unspecified: Secondary | ICD-10-CM | POA: Diagnosis not present

## 2022-05-17 DIAGNOSIS — E538 Deficiency of other specified B group vitamins: Secondary | ICD-10-CM | POA: Diagnosis not present

## 2022-06-17 DIAGNOSIS — R69 Illness, unspecified: Secondary | ICD-10-CM | POA: Diagnosis not present

## 2022-07-02 DIAGNOSIS — R69 Illness, unspecified: Secondary | ICD-10-CM | POA: Diagnosis not present

## 2022-08-12 DIAGNOSIS — R69 Illness, unspecified: Secondary | ICD-10-CM | POA: Diagnosis not present

## 2022-10-03 DIAGNOSIS — R69 Illness, unspecified: Secondary | ICD-10-CM | POA: Diagnosis not present

## 2022-10-17 DIAGNOSIS — Z1231 Encounter for screening mammogram for malignant neoplasm of breast: Secondary | ICD-10-CM | POA: Diagnosis not present

## 2022-10-28 DIAGNOSIS — R69 Illness, unspecified: Secondary | ICD-10-CM | POA: Diagnosis not present

## 2022-11-09 DIAGNOSIS — R69 Illness, unspecified: Secondary | ICD-10-CM | POA: Diagnosis not present

## 2022-11-29 DIAGNOSIS — F422 Mixed obsessional thoughts and acts: Secondary | ICD-10-CM | POA: Diagnosis not present

## 2023-01-24 DIAGNOSIS — H43811 Vitreous degeneration, right eye: Secondary | ICD-10-CM | POA: Diagnosis not present

## 2023-01-24 DIAGNOSIS — H18413 Arcus senilis, bilateral: Secondary | ICD-10-CM | POA: Diagnosis not present

## 2023-01-24 DIAGNOSIS — H11153 Pinguecula, bilateral: Secondary | ICD-10-CM | POA: Diagnosis not present

## 2023-01-24 DIAGNOSIS — H2513 Age-related nuclear cataract, bilateral: Secondary | ICD-10-CM | POA: Diagnosis not present

## 2023-01-24 DIAGNOSIS — D3131 Benign neoplasm of right choroid: Secondary | ICD-10-CM | POA: Diagnosis not present

## 2023-01-24 DIAGNOSIS — H47323 Drusen of optic disc, bilateral: Secondary | ICD-10-CM | POA: Diagnosis not present

## 2023-01-25 DIAGNOSIS — H16223 Keratoconjunctivitis sicca, not specified as Sjogren's, bilateral: Secondary | ICD-10-CM | POA: Diagnosis not present

## 2023-01-28 DIAGNOSIS — H16221 Keratoconjunctivitis sicca, not specified as Sjogren's, right eye: Secondary | ICD-10-CM | POA: Diagnosis not present

## 2023-02-05 DIAGNOSIS — R51 Headache with orthostatic component, not elsewhere classified: Secondary | ICD-10-CM | POA: Diagnosis not present

## 2023-03-16 DIAGNOSIS — R69 Illness, unspecified: Secondary | ICD-10-CM | POA: Diagnosis not present

## 2023-06-19 DIAGNOSIS — Z6828 Body mass index (BMI) 28.0-28.9, adult: Secondary | ICD-10-CM | POA: Diagnosis not present

## 2023-06-19 DIAGNOSIS — J069 Acute upper respiratory infection, unspecified: Secondary | ICD-10-CM | POA: Diagnosis not present

## 2023-09-17 DIAGNOSIS — E7800 Pure hypercholesterolemia, unspecified: Secondary | ICD-10-CM | POA: Diagnosis not present

## 2023-09-17 DIAGNOSIS — E538 Deficiency of other specified B group vitamins: Secondary | ICD-10-CM | POA: Diagnosis not present

## 2023-10-28 DIAGNOSIS — Z713 Dietary counseling and surveillance: Secondary | ICD-10-CM | POA: Diagnosis not present

## 2023-10-30 DIAGNOSIS — Z124 Encounter for screening for malignant neoplasm of cervix: Secondary | ICD-10-CM | POA: Diagnosis not present

## 2023-10-30 DIAGNOSIS — Z01419 Encounter for gynecological examination (general) (routine) without abnormal findings: Secondary | ICD-10-CM | POA: Diagnosis not present

## 2023-10-30 DIAGNOSIS — Z1231 Encounter for screening mammogram for malignant neoplasm of breast: Secondary | ICD-10-CM | POA: Diagnosis not present

## 2023-11-25 DIAGNOSIS — F422 Mixed obsessional thoughts and acts: Secondary | ICD-10-CM | POA: Diagnosis not present

## 2023-11-25 DIAGNOSIS — F410 Panic disorder [episodic paroxysmal anxiety] without agoraphobia: Secondary | ICD-10-CM | POA: Diagnosis not present

## 2023-11-27 DIAGNOSIS — Z713 Dietary counseling and surveillance: Secondary | ICD-10-CM | POA: Diagnosis not present

## 2024-03-10 DIAGNOSIS — R051 Acute cough: Secondary | ICD-10-CM | POA: Diagnosis not present

## 2024-03-10 DIAGNOSIS — Z6830 Body mass index (BMI) 30.0-30.9, adult: Secondary | ICD-10-CM | POA: Diagnosis not present
